# Patient Record
Sex: Male | Born: 2000 | Race: Black or African American | Hispanic: No | Marital: Single | Smoking: Never smoker
Health system: Southern US, Community
[De-identification: ages and names within clinical notes are randomized; demographics above are authoritative.]

## PROBLEM LIST (undated history)

## (undated) HISTORY — PX: OTHER SURGICAL HISTORY: SHX169

---

## 2019-12-25 ENCOUNTER — Encounter (HOSPITAL_COMMUNITY): Payer: Self-pay

## 2019-12-25 ENCOUNTER — Other Ambulatory Visit: Payer: Self-pay

## 2019-12-25 ENCOUNTER — Emergency Department (HOSPITAL_COMMUNITY): Payer: Medicaid Other

## 2019-12-25 ENCOUNTER — Emergency Department (HOSPITAL_COMMUNITY)
Admission: EM | Admit: 2019-12-25 | Discharge: 2019-12-25 | Disposition: A | Discharge status: Home / Self Care | Payer: Medicaid Other | Attending: Emergency Medicine | Admitting: Emergency Medicine

## 2019-12-25 DIAGNOSIS — R079 Chest pain, unspecified: Secondary | ICD-10-CM

## 2019-12-25 LAB — CBC
HCT: 47.4 % (ref 39.0–52.0)
Hemoglobin: 16.1 g/dL (ref 13.0–17.0)
MCH: 29 pg (ref 26.0–34.0)
MCHC: 34 g/dL (ref 30.0–36.0)
MCV: 85.4 fL (ref 80.0–100.0)
Platelets: 276 10*3/uL (ref 150–400)
RBC: 5.55 MIL/uL (ref 4.22–5.81)
RDW: 12.9 % (ref 11.5–15.5)
WBC: 4.6 10*3/uL (ref 4.0–10.5)
nRBC: 0 % (ref 0.0–0.2)

## 2019-12-25 LAB — BASIC METABOLIC PANEL
Anion gap: 9 (ref 5–15)
BUN: 13 mg/dL (ref 6–20)
CO2: 30 mmol/L (ref 22–32)
Calcium: 9.3 mg/dL (ref 8.9–10.3)
Chloride: 102 mmol/L (ref 98–111)
Creatinine, Ser: 1.04 mg/dL (ref 0.61–1.24)
GFR calc Af Amer: >60 mL/min (ref >60)
GFR calc non Af Amer: >60 mL/min (ref >60)
Glucose, Bld: 108 mg/dL — ABNORMAL HIGH (ref 70–99)
Potassium: 4.2 mmol/L (ref 3.5–5.1)
Sodium: 141 mmol/L (ref 135–145)

## 2019-12-25 LAB — TROPONIN I (HIGH SENSITIVITY): Troponin I (High Sensitivity): <2 ng/L (ref <18)

## 2019-12-25 MED ORDER — NAPROXEN 500 MG PO TABS: 500.0000 mg | ORAL_TABLET | Freq: Two times a day (BID) | ORAL | 0 refills | Status: DC | PRN

## 2019-12-25 MED ORDER — NAPROXEN 500 MG PO TABS
500.0000 mg | ORAL_TABLET | Freq: Once | ORAL | Status: AC
  Administered 2019-12-25: 500 mg via ORAL
  Filled 2019-12-25: qty 1

## 2019-12-25 NOTE — Discharge Instructions (Addendum)
You were seen in the emergency department today for chest pain. Your work-up in the emergency department has been overall reassuring. Your labs have been fairly normal and or similar to previous blood work you have had done. Your EKG and the enzyme we use to check your heart did not show an acute heart attack at this time. Your chest x-ray was normal.   We are sending you home with naproxen to help with pain.  - Naproxen is a nonsteroidal anti-inflammatory medication that will help with pain and swelling. Be sure to take this medication as prescribed with food, 1 pill every 12 hours,  It should be taken with food, as it can cause stomach upset, and more seriously, stomach bleeding. Do not take other nonsteroidal anti-inflammatory medications with this such as Advil, Motrin, Aleve, Mobic, Goodie Powder, or Motrin.    You make take Tylenol per over the counter dosing with these medications.   We have prescribed you new medication(s) today. Discuss the medications prescribed today with your pharmacist as they can have adverse effects and interactions with your other medicines including over the counter and prescribed medications. Seek medical evaluation if you start to experience new or abnormal symptoms after taking one of these medicines, seek care immediately if you start to experience difficulty breathing, feeling of your throat closing, facial swelling, or rash as these could be indications of a more serious allergic reaction   We would like you to follow up closely with your primary care provider and/or the cardiologist provided in your discharge instructions within 1-3 days. Return to the ER immediately should you experience any new or worsening symptoms including but not limited to return of pain, worsened pain, vomiting, shortness of breath, dizziness, lightheadedness, passing out, or any other concerns that you may have.

## 2019-12-25 NOTE — ED Triage Notes (Signed)
Pt sts left sided chest tightness for 2 days. Denies pain but describes it as a weird feeling.

## 2019-12-25 NOTE — ED Provider Notes (Signed)
[facility] DEPT Provider Note   CSN: [id number] Arrival date & time: 12/25/19  2055     History Chief Complaint  Patient presents with  . Chest Pain    Daniel Winters is a 19 y.o. male without significant past medical hx who presents to the ED with complaints of chest pain intermittently x 2 days. Patient reports pain is a sharp/tightness to the left chest that lasts a few minutes to a few hours at a time, sometimes with certain breathing, no other triggers or alleviating/aggravating factors, no change with exertion. 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 Last episode of discomfort started about 3-4 hours ago, currently a 3/10 in severity. He denies fever, chills, cough, dyspnea, nausea, vomiting, diaphoresis, dizziness, lightheadedness, numbness, weakness, early family hx of CAD, leg pain/swelling, hemoptysis, recent surgery/trauma, recent long travel, hormone use, personal hx of cancer, or hx of DVT/PE. Denies cocaine/methamphetamine use.  HPI     History reviewed. No pertinent past medical history.  There are no problems to display for this patient.   History reviewed. No pertinent surgical history.     No family history on file.  Social History   Tobacco Use  . Smoking status: Not on file  Substance Use Topics  . Alcohol use: Not on file  . Drug use: Not on file    Home Medications Prior to Admission medications   Not on File    Allergies    Patient has no known allergies.  Review of Systems   Review of Systems  Constitutional: Negative for chills, diaphoresis and fever.  Respiratory: Negative for cough and shortness of breath.   Cardiovascular: Positive for chest pain. Negative for palpitations and leg swelling.  Gastrointestinal: Negative for abdominal pain, diarrhea, nausea and vomiting.  Neurological: Negative for dizziness, syncope, weakness, light-headedness and  numbness.  All other systems reviewed and are negative.   Physical Exam Updated Vital Signs BP (!) 164/100   Pulse 90   Temp 98.3 F (36.8 C)   Resp 16   Ht 6\' 3"  (1.905 m)   Wt 90.7 kg   SpO2 100%   BMI 25.00 kg/m   Physical Exam Vitals and nursing note reviewed.  Constitutional:      General: He is not in acute distress.    Appearance: He is well-developed. He is not toxic-appearing.  HENT:     Head: Normocephalic and atraumatic.  Eyes:     General:        Right eye: No discharge.        Left eye: No discharge.     Conjunctiva/sclera: Conjunctivae normal.  Cardiovascular:     Rate and Rhythm: Normal rate and regular rhythm.     Pulses:          Radial pulses are 2+ on the right side and 2+ on the left side.  Pulmonary:     Effort: Pulmonary effort is normal. No respiratory distress.     Breath sounds: Normal breath sounds. No wheezing, rhonchi or rales.  Chest:     Chest wall: No mass, deformity, tenderness, crepitus or edema.  Abdominal:     General: There is no distension.     Palpations: Abdomen is soft.     Tenderness: There is no abdominal tenderness. There is no guarding or rebound.  Musculoskeletal:     Cervical back: Neck supple.     Right lower leg:  No tenderness. No edema.     Left lower leg: No tenderness. No edema.  Skin:    General: Skin is warm and dry.     Findings: No rash.  Neurological:     Mental Status: He is alert.     Comments: Clear speech.   Psychiatric:        Behavior: Behavior normal.     ED Results / Procedures / Treatments   Labs (all labs ordered are listed, but only abnormal results are displayed) Labs Reviewed  BASIC METABOLIC PANEL - Abnormal; Notable for the following components:      Result Value   Glucose, Bld 108 (*)    All other components within normal limits  CBC  TROPONIN I (HIGH SENSITIVITY)    EKG EKG Interpretation  Date/Time:  Thursday December 25 2019 21:06:48 EDT Ventricular Rate:  86 PR Interval:     QRS Duration: 91 QT Interval:  344 QTC Calculation: 412 R Axis:   88 Text Interpretation: Sinus rhythm Atrial premature complex 12 Lead; [place] No previous ECGs available Confirmed by Richardean Canal [phone]) on 12/25/2019 11:29:22 PM   Radiology DG Chest 2 View  Result Date: 12/25/2019 CLINICAL DATA:  Left-sided chest pain. EXAM: CHEST - 2 VIEW COMPARISON:  None. FINDINGS: The cardiomediastinal contours are normal. The lungs are clear. Pulmonary vasculature is normal. No consolidation, pleural effusion, or pneumothorax. No acute osseous abnormalities are seen. IMPRESSION: Negative radiographs of the chest. Electronically Signed   By: Narda Rutherford M.D.   On: 12/25/2019 21:27    Procedures Procedures (including critical care time)  Medications Ordered in ED Medications  naproxen (NAPROSYN) tablet 500 mg (500 mg Oral Given 12/25/19 2238)    ED Course  I have reviewed the triage vital signs and the nursing notes.  Pertinent labs & imaging results that were available during my care of the patient were reviewed by me and considered in my medical decision making (see chart for details).    MDM Rules/Calculators/A&P                         Patient presents to the ED with complaints of chest pain. Nontoxic, vitals WNL with the exception of elevated BP- normalized by my assessment- low suspicion for HTN emergency.   DDX: ACS, pulmonary embolism, dissection, pneumothorax, pneumonia, arrhythmia, severe anemia, MSK, GERD, anxiety, pleurisy, pericarditis.   Additional history obtained:  Additional history obtained from review of nursing notes.  EKG: No STEMI.  Lab Tests:  I reviewed and interpreted labs, which included:  CBC: No anemia or leukocytosis.  BMP: No significant electrolyte derangement.  Troponin: < 2   Imaging Studies ordered:  Chest x-ray obtained per triage protocol, I independently visualized and interpreted imaging which showed no acute process  ED Course:    Reassuring labs & CXR. HEAR score indicates low risk- EKG without obvious acute ischemia, troponin < 2 after 3 hours of pain, doubt ACS. Patient is low risk wells, PERC negative, doubt pulmonary embolism. Pain is not a tearing sensation, symmetric pulses, no widening of mediastinum on CXR, doubt dissection. Cardiac monitor reviewed, no notable arrhythmias. No ST changes to raise concern for pericarditis. Given dose of naproxen with improvement on re-evaluation at 23:12. Unclear definitive etiology, considering pleurisy vs. MSK- will treat with NSAIDs. Patient has appeared hemodynamically stable throughout ER visit and appears safe for discharge with close PCP follow up. I discussed results, treatment plan, need for PCP follow-up, and  return precautions with the patient. Provided opportunity for questions, patient confirmed understanding and is in agreement with plan.   Blood pressure 132/80, pulse 69, temperature 98.3 F (36.8 C), resp. rate 14, height 6\' 3"  (1.905 m), weight 90.7 kg, SpO2 98 %.  Portions of this note were generated with . Dictation errors may occur despite best attempts at proofreading.  Final Clinical Impression(s) / ED Diagnoses Final diagnoses:  Chest pain, unspecified type    Rx / DC Orders ED Discharge Orders         Ordered    naproxen (NAPROSYN) 500 MG tablet  2 times daily PRN     Discontinue  Reprint     12/25/19 2317           Roselyne Stalnaker, 12/27/19, PA-C 12/25/19 2333    2334, MD 12/29/19 1459

## 2021-10-01 IMAGING — CR DG CHEST 2V
2 series · 2 of 2 positions shown · non-contrast
Comparison: None.

CLINICAL DATA: Left-sided chest pain.

EXAM:
CHEST - 2 VIEW

[w chest pa]
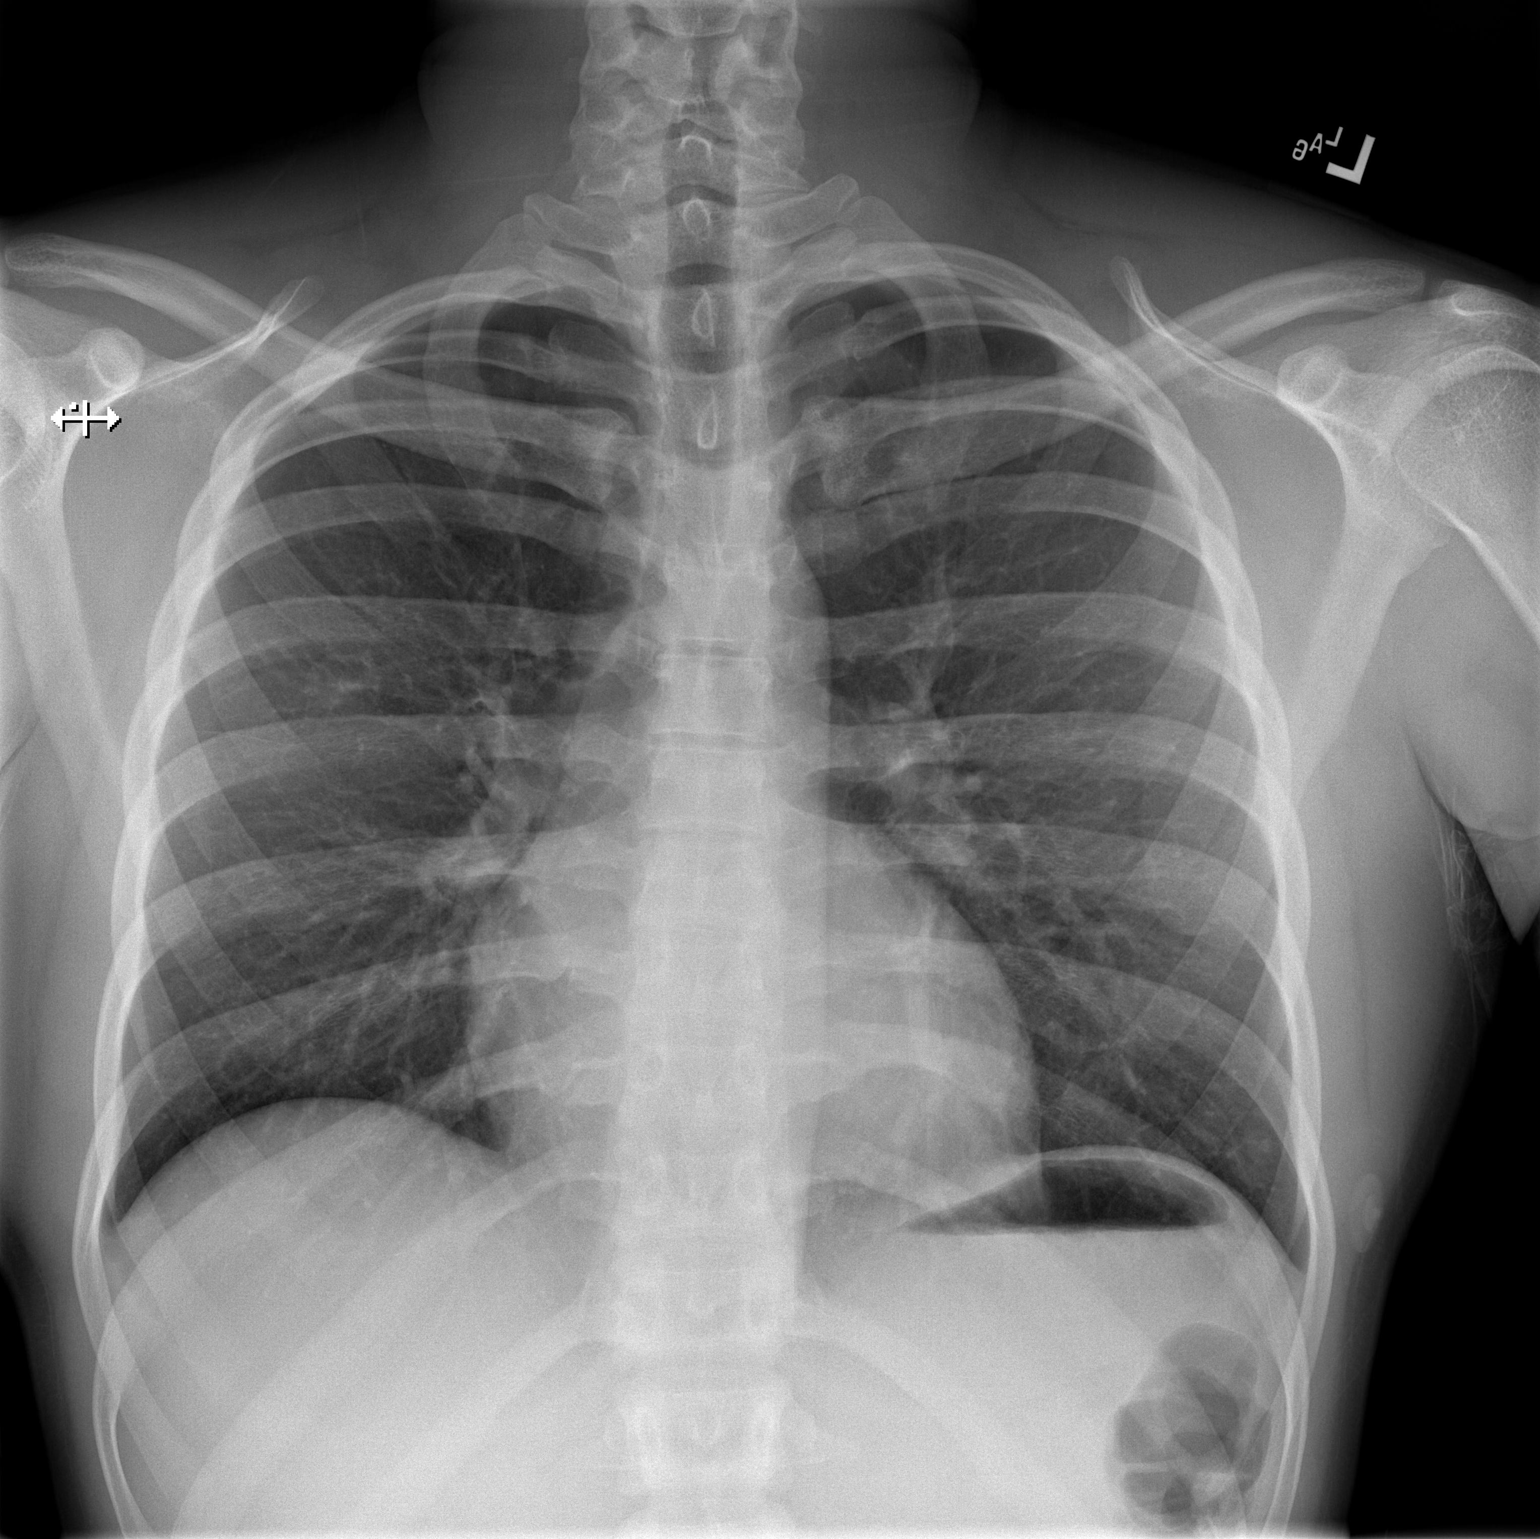

[w chest lat]
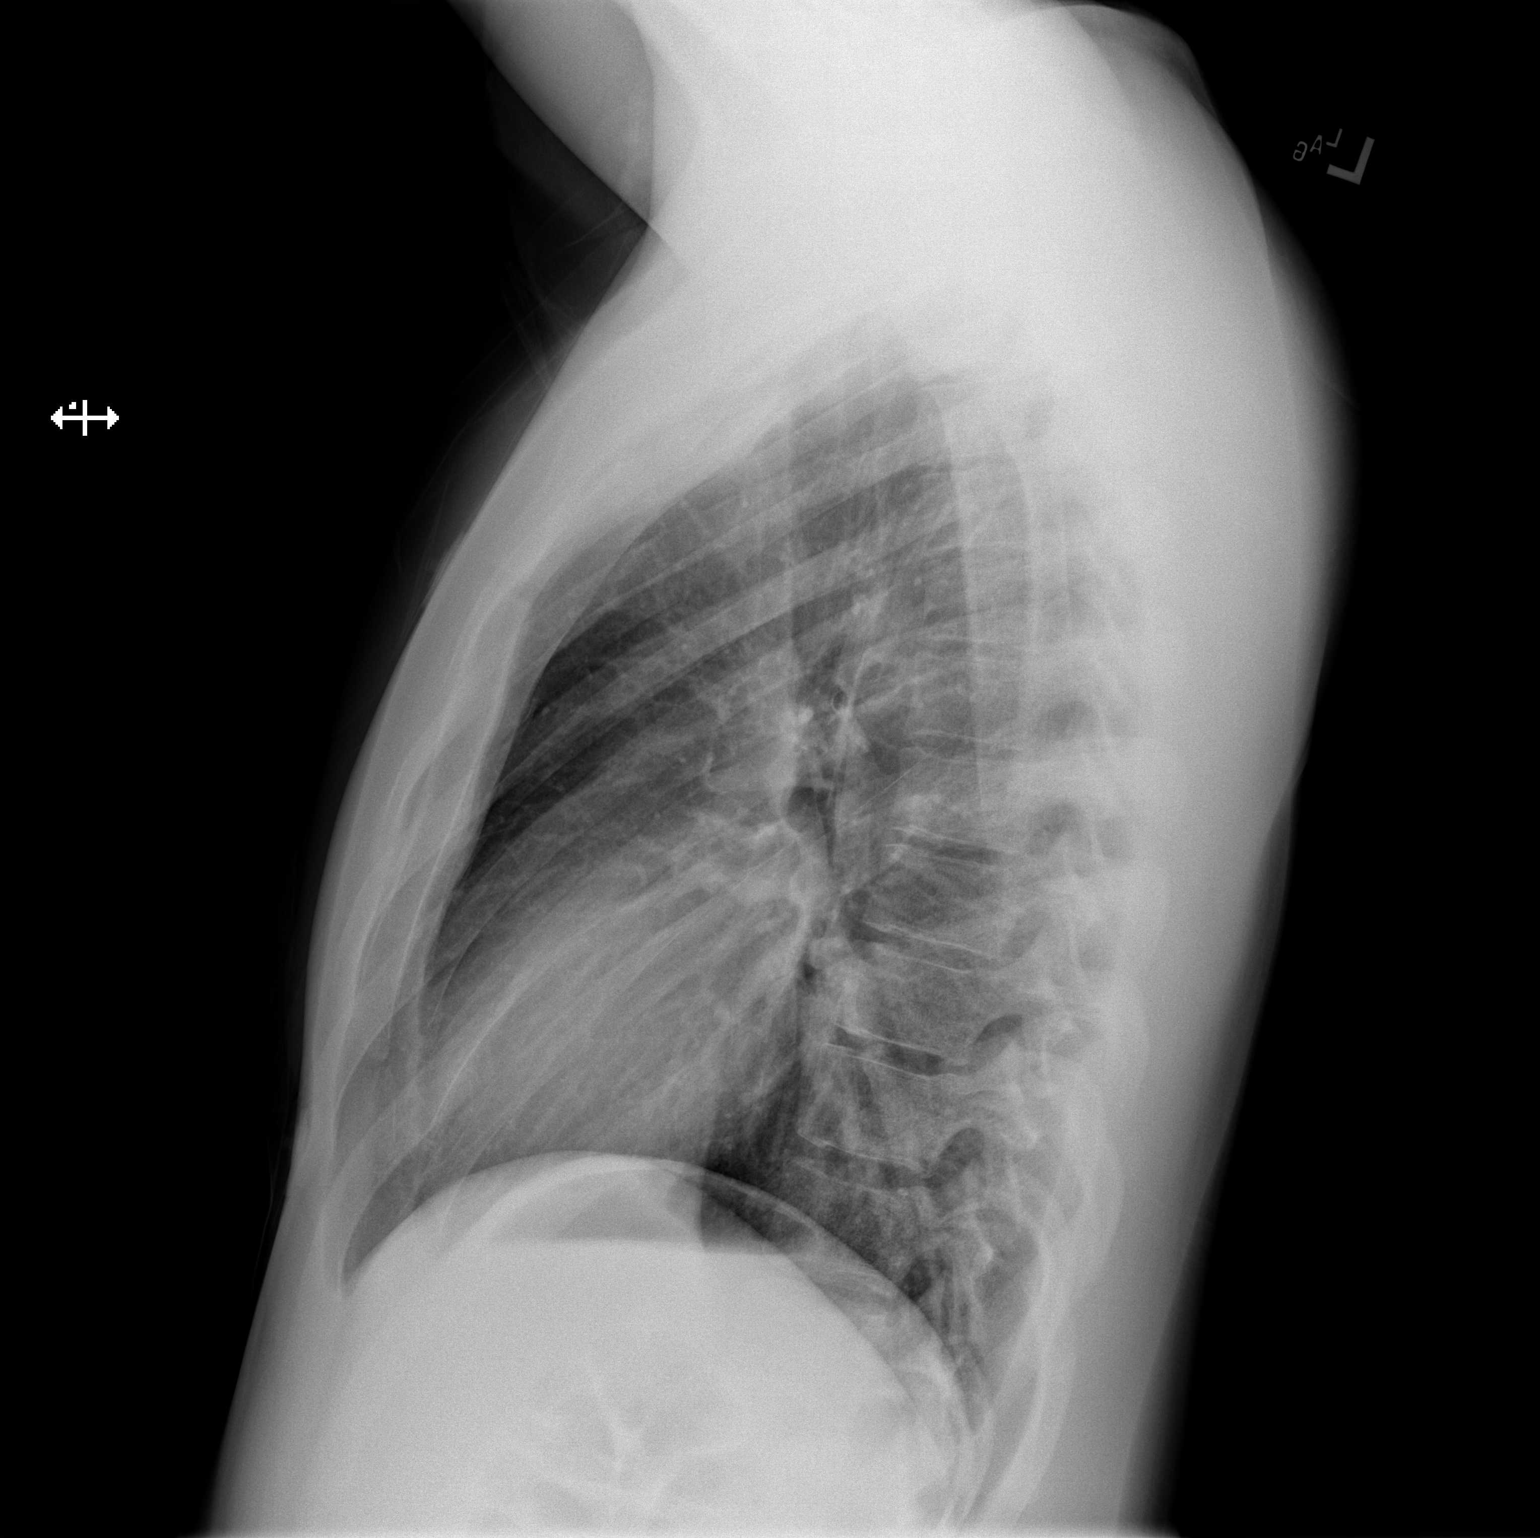

[2 of 2 positions shown; findings below may reference images not displayed]

FINDINGS: The cardiomediastinal contours are normal. The lungs are clear.
Pulmonary vasculature is normal. No consolidation, pleural effusion,
or pneumothorax. No acute osseous abnormalities are seen.
IMPRESSION: Negative radiographs of the chest.

## 2022-03-22 ENCOUNTER — Other Ambulatory Visit (HOSPITAL_BASED_OUTPATIENT_CLINIC_OR_DEPARTMENT_OTHER): Payer: Self-pay | Admitting: Obstetrics & Gynecology

## 2022-03-22 DIAGNOSIS — Z1379 Encounter for other screening for genetic and chromosomal anomalies: Secondary | ICD-10-CM

## 2022-03-23 NOTE — Addendum Note (Signed)
Addended by: Blenda Nicely on: 03/23/2022 12:56 PM   Modules accepted: Orders

## 2022-04-06 LAB — HORIZON CUSTOM: REPORT SUMMARY: NEGATIVE

## 2023-08-24 ENCOUNTER — Emergency Department (HOSPITAL_BASED_OUTPATIENT_CLINIC_OR_DEPARTMENT_OTHER)
Admission: EM | Admit: 2023-08-24 | Discharge: 2023-08-24 | Disposition: A | Discharge status: Home / Self Care | Payer: 59 | Attending: Emergency Medicine | Admitting: Emergency Medicine

## 2023-08-24 ENCOUNTER — Encounter (HOSPITAL_BASED_OUTPATIENT_CLINIC_OR_DEPARTMENT_OTHER): Payer: Self-pay

## 2023-08-24 ENCOUNTER — Other Ambulatory Visit: Payer: Self-pay

## 2023-08-24 DIAGNOSIS — R197 Diarrhea, unspecified: Secondary | ICD-10-CM | POA: Diagnosis present

## 2023-08-24 LAB — CBC
HCT: 43.9 % (ref 39.0–52.0)
Hemoglobin: 15.1 g/dL (ref 13.0–17.0)
MCH: 28.5 pg (ref 26.0–34.0)
MCHC: 34.4 g/dL (ref 30.0–36.0)
MCV: 83 fL (ref 80.0–100.0)
Platelets: 258 10*3/uL (ref 150–400)
RBC: 5.29 MIL/uL (ref 4.22–5.81)
RDW: 12.9 % (ref 11.5–15.5)
WBC: 5.1 10*3/uL (ref 4.0–10.5)
nRBC: 0 % (ref 0.0–0.2)

## 2023-08-24 LAB — COMPREHENSIVE METABOLIC PANEL
ALT: 25 U/L (ref 0–44)
AST: 17 U/L (ref 15–41)
Albumin: 4.6 g/dL (ref 3.5–5.0)
Alkaline Phosphatase: 60 U/L (ref 38–126)
Anion gap: 9 (ref 5–15)
BUN: 11 mg/dL (ref 6–20)
CO2: 24 mmol/L (ref 22–32)
Calcium: 9.1 mg/dL (ref 8.9–10.3)
Chloride: 102 mmol/L (ref 98–111)
Creatinine, Ser: 0.74 mg/dL (ref 0.61–1.24)
GFR, Estimated: >60 mL/min (ref >60)
Glucose, Bld: 109 mg/dL — ABNORMAL HIGH (ref 70–99)
Potassium: 3.7 mmol/L (ref 3.5–5.1)
Sodium: 135 mmol/L (ref 135–145)
Total Bilirubin: 1 mg/dL (ref 0.0–1.2)
Total Protein: 7.3 g/dL (ref 6.5–8.1)

## 2023-08-24 LAB — URINALYSIS, ROUTINE W REFLEX MICROSCOPIC
Bilirubin Urine: NEGATIVE
Glucose, UA: NEGATIVE mg/dL
Hgb urine dipstick: NEGATIVE
Ketones, ur: NEGATIVE mg/dL
Leukocytes,Ua: NEGATIVE
Nitrite: NEGATIVE
Protein, ur: NEGATIVE mg/dL
Specific Gravity, Urine: >=1.03 (ref 1.005–1.030)
pH: 6 (ref 5.0–8.0)

## 2023-08-24 LAB — RESP PANEL BY RT-PCR (RSV, FLU A&B, COVID)  RVPGX2
Influenza A by PCR: NEGATIVE
Influenza B by PCR: NEGATIVE
Resp Syncytial Virus by PCR: NEGATIVE
SARS Coronavirus 2 by RT PCR: NEGATIVE

## 2023-08-24 LAB — LIPASE, BLOOD: Lipase: 28 U/L (ref 11–51)

## 2023-08-24 MED ORDER — ONDANSETRON 4 MG PO TBDP: 4.0000 mg | ORAL_TABLET | Freq: Three times a day (TID) | ORAL | 0 refills | Status: DC | PRN

## 2023-08-24 MED ORDER — LOPERAMIDE HCL 2 MG PO CAPS: 2.0000 mg | ORAL_CAPSULE | Freq: Four times a day (QID) | ORAL | 0 refills | Status: DC | PRN

## 2023-08-24 NOTE — ED Provider Notes (Signed)
 [facility] Provider Note   CSN: [id number] Arrival date & time: 08/24/23  1802    History  Chief Complaint  Patient presents with   Diarrhea   Tingling    Daniel Winters is a 23 y.o. male here for evaluation of diarrhea.  Started today.  Has had some fatigue over the last few days.  Yesterday ate at [employer].  Today has had 6 episodes of loose stool.  No blood in stool.  No abdominal pain.  No recent antibiotics or travel.  No fever, nausea, vomiting, cough, congestion and rhinorrhea.  Has had an overall decreased appetite.  Has been able to drink water however has not any solid food today due to decreased appetite.  Has noted a vibrating sensation to finger tips BIL.  Intermittent in nature.  Not worse with position changes.  No headache, slurred speech, numbness or weakness.  No back pain.  No sick contacts, no one else is sick with had similar food.  That he is aware of    HPI     Home Medications Prior to Admission medications   Medication Sig Start Date End Date Taking? Authorizing Provider  loperamide (IMODIUM) 2 MG capsule Take 1 capsule (2 mg total) by mouth 4 (four) times daily as needed for diarrhea or loose stools. 08/24/23  Yes Alixis Harmon A, PA-C  ondansetron (ZOFRAN-ODT) 4 MG disintegrating tablet Take 1 tablet (4 mg total) by mouth every 8 (eight) hours as needed. 08/24/23  Yes Lovely Kerins A, PA-C  naproxen (NAPROSYN) 500 MG tablet Take 1 tablet (500 mg total) by mouth 2 (two) times daily as needed for moderate pain. 12/25/19   Petrucelli, Pleas Koch, PA-C      Allergies    Patient has no known allergies.    Review of Systems   Review of Systems  Constitutional: Negative.   HENT: Negative.    Respiratory: Negative.    Cardiovascular: Negative.   Gastrointestinal:  Positive for diarrhea. Negative for abdominal distention, abdominal pain, anal bleeding, blood in stool, constipation, nausea, rectal pain and  vomiting.  Genitourinary: Negative.   Musculoskeletal: Negative.   Skin: Negative.   Neurological: Negative.   All other systems reviewed and are negative.   Physical Exam Updated Vital Signs BP 138/77 (BP Location: Left Arm)   Pulse 83   Temp 98.4 F (36.9 C) (Oral)   Resp 18   Ht 6\' 3"  (1.905 m)   Wt 90.7 kg   SpO2 100%   BMI 25.00 kg/m  Physical Exam Vitals and nursing note reviewed.  Constitutional:      General: He is not in acute distress.    Appearance: He is well-developed. He is not ill-appearing, toxic-appearing or diaphoretic.  HENT:     Head: Atraumatic.     Nose: Nose normal.     Mouth/Throat:     Mouth: Mucous membranes are moist.  Eyes:     Pupils: Pupils are equal, round, and reactive to light.  Cardiovascular:     Rate and Rhythm: Normal rate and regular rhythm.     Pulses: Normal pulses.     Heart sounds: Normal heart sounds.  Pulmonary:     Effort: Pulmonary effort is normal. No respiratory distress.     Breath sounds: Normal breath sounds.  Abdominal:     General: Bowel sounds are normal. There is no distension.     Palpations: Abdomen is soft.     Tenderness: There is  no abdominal tenderness. There is no right CVA tenderness, left CVA tenderness, guarding or rebound.  Musculoskeletal:        General: No swelling, tenderness, deformity or signs of injury. Normal range of motion.     Cervical back: Normal range of motion and neck supple.     Right lower leg: No edema.     Left lower leg: No edema.  Skin:    General: Skin is warm and dry.     Capillary Refill: Capillary refill takes less than 2 seconds.  Neurological:     General: No focal deficit present.     Mental Status: He is alert and oriented to person, place, and time.     Cranial Nerves: No cranial nerve deficit.     Sensory: No sensory deficit.     Motor: No weakness.     Gait: Gait normal.     ED Results / Procedures / Treatments   Labs (all labs ordered are listed, but only  abnormal results are displayed) Labs Reviewed  COMPREHENSIVE METABOLIC PANEL - Abnormal; Notable for the following components:      Result Value   Glucose, Bld 109 (*)    All other components within normal limits  RESP PANEL BY RT-PCR (RSV, FLU A&B, COVID)  RVPGX2  LIPASE, BLOOD  CBC  URINALYSIS, ROUTINE W REFLEX MICROSCOPIC    EKG None  Radiology No results found.  Procedures Procedures    Medications Ordered in ED Medications - No data to display  ED Course/ Medical Decision Making/ A&P   23 year old here for evaluation of diarrhea.  Started today. 5-6 episodes of nonbloody stool.  No abdominal pain.  Did eat [employer] yesterday.  No known sick contacts.  Has had some decreased appetite.  Earlier had some vibration sense to his distal fingers.  Subsequently resolved.  He has a nonfocal neuroexam without deficits.  No headache.  Will plan on labs and reassess  Labs personally viewed and interpreted:  Viral panel negative CBC without leukocytosis Metabolic panel without significant abnormality Lipase 28 UA negative for infection  Patient reassessed.  We discussed his labs.  I suspect he likely has gastroenteritis.  No recent antibiotics or travel to suggest bacterial source of diarrhea.  His abdomen is soft, nontender.  Discussed symptomatic management help him follow-up outpatient, return for worsening symptoms  Patient is nontoxic, nonseptic appearing, in no apparent distress.  Patient's pain and other symptoms adequately managed in emergency department.  Fluid bolus given.  Labs, imaging and vitals reviewed.  Patient does not meet the SIRS or Sepsis criteria.  On repeat exam patient does not have a surgical abdomin and there are no peritoneal signs.  No indication of appendicitis, bowel obstruction, bowel perforation, cholecystitis, diverticulitis.  Patient discharged home with symptomatic treatment and given strict instructions for follow-up with their primary care  physician.  I have also discussed reasons to return immediately to the ER.  Patient expresses understanding and agrees with plan.                                 Medical Decision Making Amount and/or Complexity of Data Reviewed Independent Historian: friend External Data Reviewed: labs, radiology and notes. Labs: ordered. Decision-making details documented in ED Course.  Risk OTC drugs. Prescription drug management. Decision regarding hospitalization. Diagnosis or treatment significantly limited by social determinants of health.          Final  Clinical Impression(s) / ED Diagnoses Final diagnoses:  Diarrhea, unspecified type    Rx / DC Orders ED Discharge Orders          Ordered    ondansetron (ZOFRAN-ODT) 4 MG disintegrating tablet  Every 8 hours PRN        08/24/23 1918    loperamide (IMODIUM) 2 MG capsule  4 times daily PRN        08/24/23 1918              Veroncia Jezek A, PA-C 08/24/23 2331    Pricilla Loveless, MD 08/29/23 1056

## 2023-08-24 NOTE — Discharge Instructions (Signed)
It was a pleasure taking care of you today.  Your work up was reassuring you do have a viral infection that is causing your loose stool or food poisoning  We have written for a few medications to help  Make sure to hydrate with Gatorade, Pedialyte at home to make sure you are staying hydrated  Follow-up outpatient, return for new or worsening symptoms such as high fever, blood in stool, severe abdominal pain

## 2023-08-24 NOTE — ED Triage Notes (Signed)
Patient arrives POV with complaints of ongoing diarrhea that started today (6-7 occurrences of diarrhea) and some tingling in his hands. Reports no pain.

## 2023-09-24 ENCOUNTER — Ambulatory Visit (HOSPITAL_BASED_OUTPATIENT_CLINIC_OR_DEPARTMENT_OTHER)
Admission: RE | Admit: 2023-09-24 | Discharge: 2023-09-24 | Disposition: A | Discharge status: Home / Self Care | Source: Ambulatory Visit | Attending: Urgent Care | Admitting: Urgent Care

## 2023-09-24 ENCOUNTER — Ambulatory Visit
Admission: RE | Admit: 2023-09-24 | Discharge: 2023-09-24 | Disposition: A | Discharge status: Home / Self Care | Source: Ambulatory Visit | Attending: Family Medicine | Admitting: Family Medicine

## 2023-09-24 VITALS — BP 119/80 | HR 81 | Temp 98.0°F | Resp 16

## 2023-09-24 DIAGNOSIS — R0789 Other chest pain: Secondary | ICD-10-CM | POA: Insufficient documentation

## 2023-09-24 DIAGNOSIS — R5383 Other fatigue: Secondary | ICD-10-CM

## 2023-09-24 MED ORDER — NAPROXEN 500 MG PO TABS: 500.0000 mg | ORAL_TABLET | Freq: Two times a day (BID) | ORAL | 0 refills | Status: AC | PRN

## 2023-09-24 NOTE — ED Provider Notes (Signed)
 [facility]  Note:  This document was prepared using Conservation officer, historic buildings and may include unintentional dictation errors.  MRN: [id number] DOB: 2000/11/27  Subjective:   Daniel Winters is a 23 y.o. male presenting for 1 month history of persistent intermittent dizziness, left-sided chest pain and chest tightness, brain fog, fatigue, left arm numbness and tingling including the hand.  Was initially seen a month ago through the emergency room.  Lab test were negative.  No chest x-ray or EKG was done.  Patient cannot recall if he had chest pain at the time.  He does report a similar episode that happened a few years ago and resolved with the use of naproxen.  He did go see his PCP but did not complete workup with them.    No confusion, weakness, vision changes, speech changes, shortness of breath, wheezing, nausea, vomiting, abdominal pain, rashes, joint pains, bruising, weight loss.  No drug use.  No alcohol use.  No smoking of any kind.  No current facility-administered medications for this encounter.  Current Outpatient Medications:    loperamide (IMODIUM) 2 MG capsule, Take 1 capsule (2 mg total) by mouth 4 (four) times daily as needed for diarrhea or loose stools., Disp: 12 capsule, Rfl: 0   naproxen (NAPROSYN) 500 MG tablet, Take 1 tablet (500 mg total) by mouth 2 (two) times daily as needed for moderate pain (pain score 4-6)., Disp: 15 tablet, Rfl: 0   ondansetron (ZOFRAN-ODT) 4 MG disintegrating tablet, Take 1 tablet (4 mg total) by mouth every 8 (eight) hours as needed., Disp: 20 tablet, Rfl: 0   No Known Allergies  History reviewed. No pertinent past medical history.   History reviewed. No pertinent surgical history.  No family history on file.  Social History   Tobacco Use   Smoking status: Never   Smokeless tobacco: Never  Vaping Use   Vaping status: Never Used  Substance Use Topics   Alcohol use: Not Currently   Drug use: Not  Currently    ROS   Objective:   Vitals: BP 119/80 (BP Location: Left Arm)   Pulse 81   Temp 98 F (36.7 C) (Oral)   Resp 16   SpO2 98%   Physical Exam Constitutional:      General: He is not in acute distress.    Appearance: Normal appearance. He is well-developed and normal weight. He is not ill-appearing, toxic-appearing or diaphoretic.  HENT:     Head: Normocephalic and atraumatic.     Right Ear: External ear normal.     Left Ear: External ear normal.     Nose: Nose normal.     Mouth/Throat:     Mouth: Mucous membranes are moist.     Pharynx: No pharyngeal swelling, oropharyngeal exudate, posterior oropharyngeal erythema or uvula swelling.     Tonsils: No tonsillar exudate or tonsillar abscesses. 0 on the right. 0 on the left.  Eyes:     General: No scleral icterus.       Right eye: No discharge.        Left eye: No discharge.     Extraocular Movements: Extraocular movements intact.  Cardiovascular:     Rate and Rhythm: Normal rate and regular rhythm.     Heart sounds: Normal heart sounds. No murmur heard.    No friction rub. No gallop.  Pulmonary:     Effort: Pulmonary effort is normal. No respiratory distress.     Breath sounds: Normal breath sounds.  No stridor. No wheezing, rhonchi or rales.  Musculoskeletal:     Cervical back: Normal range of motion.  Neurological:     Mental Status: He is alert and oriented to person, place, and time.     Cranial Nerves: No cranial nerve deficit.     Motor: No weakness.     Coordination: Coordination normal.     Gait: Gait normal.  Psychiatric:        Mood and Affect: Mood normal.        Behavior: Behavior normal.        Thought Content: Thought content normal.        Judgment: Judgment normal.    ED ECG REPORT   Date: 09/24/2023  EKG Time: 12:56 PM  Rate: 69 bpm  Rhythm: normal sinus rhythm  Axis: Normal  Intervals: Incomplete right bundle branch block  ST&T Change: None  Narrative Interpretation: Sinus rhythm  at 69 bpm with an incomplete right bundle branch block.  The latter is new compared to his EKG from 2021.  Recent Results (from the past 2160 hours)  Lipase, blood     Status: None   Collection Time: 08/24/23  6:09 PM  Result Value Ref Range   Lipase 28 11 - 51 U/L    Comment: Performed at [facility], [address] [place], [place] [address]  Comprehensive metabolic panel     Status: Abnormal   Collection Time: 08/24/23  6:09 PM  Result Value Ref Range   Sodium 135 135 - 145 mmol/L   Potassium 3.7 3.5 - 5.1 mmol/L   Chloride 102 98 - 111 mmol/L   CO2 24 22 - 32 mmol/L   Glucose, Bld 109 (H) 70 - 99 mg/dL    Comment: Glucose reference range applies only to samples taken after fasting for at least 8 hours.   BUN 11 6 - 20 mg/dL   Creatinine, Ser 6.04 0.61 - 1.24 mg/dL   Calcium 9.1 8.9 - 54.0 mg/dL   Total Protein 7.3 6.5 - 8.1 g/dL   Albumin 4.6 3.5 - 5.0 g/dL   AST 17 15 - 41 U/L   ALT 25 0 - 44 U/L   Alkaline Phosphatase 60 38 - 126 U/L   Total Bilirubin 1.0 0.0 - 1.2 mg/dL   GFR, Estimated >98 >11 mL/min    Comment: (NOTE) Calculated using the CKD-EPI Creatinine Equation (2021)    Anion gap 9 5 - 15    Comment: Performed at [facility], [address] [place], [place] [address]  CBC     Status: None   Collection Time: 08/24/23  6:09 PM  Result Value Ref Range   WBC 5.1 4.0 - 10.5 K/uL   RBC 5.29 4.22 - 5.81 MIL/uL   Hemoglobin 15.1 13.0 - 17.0 g/dL   HCT 29.5 62.1 - 30.8 %   MCV 83.0 80.0 - 100.0 fL   MCH 28.5 26.0 - 34.0 pg   MCHC 34.4 30.0 - 36.0 g/dL   RDW 65.7 84.6 - 96.2 %   Platelets 258 150 - 400 K/uL   nRBC 0.0 0.0 - 0.2 %    Comment: Performed at [facility], [address] [place], [place] [address]  Urinalysis, Routine w reflex microscopic -Urine, Clean Catch     Status: None   Collection Time: 08/24/23  6:09 PM  Result Value Ref Range   Color, Urine YELLOW YELLOW   APPearance CLEAR CLEAR  Specific Gravity,  Urine >=1.030 1.005 - 1.030   pH 6.0 5.0 - 8.0   Glucose, UA NEGATIVE NEGATIVE mg/dL   Hgb urine dipstick NEGATIVE NEGATIVE   Bilirubin Urine NEGATIVE NEGATIVE   Ketones, ur NEGATIVE NEGATIVE mg/dL   Protein, ur NEGATIVE NEGATIVE mg/dL   Nitrite NEGATIVE NEGATIVE   Leukocytes,Ua NEGATIVE NEGATIVE    Comment: Microscopic not done on urines with negative protein, blood, leukocytes, nitrite, or glucose < 500 mg/dL. Performed at [facility], [address] [place], [place] [address]   Resp panel by RT-PCR (RSV, Flu A&B, Covid) Anterior Nasal Swab     Status: None   Collection Time: 08/24/23  7:20 PM   Specimen: Anterior Nasal Swab  Result Value Ref Range   SARS Coronavirus 2 by RT PCR NEGATIVE NEGATIVE    Comment: (NOTE) SARS-CoV-2 target nucleic acids are NOT DETECTED.  The SARS-CoV-2 RNA is generally detectable in upper respiratory specimens during the acute phase of infection. The lowest concentration of SARS-CoV-2 viral copies this assay can detect is 138 copies/mL. A negative result does not preclude SARS-Cov-2 infection and should not be used as the sole basis for treatment or other patient management decisions. A negative result may occur with  improper specimen collection/handling, submission of specimen other than nasopharyngeal swab, presence of viral mutation(s) within the areas targeted by this assay, and inadequate number of viral copies(<138 copies/mL). A negative result must be combined with clinical observations, patient history, and epidemiological information. The expected result is Negative.  Fact Sheet for Patients:  BloggerCourse.com  Fact Sheet for Healthcare Providers:  SeriousBroker.it  This test is no t yet approved or cleared by the [place] FDA and  has been authorized for detection and/or diagnosis of SARS-CoV-2 by FDA under an Emergency Use Authorization (EUA). This EUA will  remain  in effect (meaning this test can be used) for the duration of the COVID-19 declaration under Section 564(b)(1) of the Act, 21 U.S.C.section 360bbb-3(b)(1), unless the authorization is terminated  or revoked sooner.       Influenza A by PCR NEGATIVE NEGATIVE   Influenza B by PCR NEGATIVE NEGATIVE    Comment: (NOTE) The Xpert Xpress SARS-CoV-2/FLU/RSV plus assay is intended as an aid in the diagnosis of influenza from Nasopharyngeal swab specimens and should not be used as a sole basis for treatment. Nasal washings and aspirates are unacceptable for Xpert Xpress SARS-CoV-2/FLU/RSV testing.  Fact Sheet for Patients: BloggerCourse.com  Fact Sheet for Healthcare Providers: SeriousBroker.it  This test is not yet approved or cleared by the [place] FDA and has been authorized for detection and/or diagnosis of SARS-CoV-2 by FDA under an Emergency Use Authorization (EUA). This EUA will remain in effect (meaning this test can be used) for the duration of the COVID-19 declaration under Section 564(b)(1) of the Act, 21 U.S.C. section 360bbb-3(b)(1), unless the authorization is terminated or revoked.     Resp Syncytial Virus by PCR NEGATIVE NEGATIVE    Comment: (NOTE) Fact Sheet for Patients: BloggerCourse.com  Fact Sheet for Healthcare Providers: SeriousBroker.it  This test is not yet approved or cleared by the [place] FDA and has been authorized for detection and/or diagnosis of SARS-CoV-2 by FDA under an Emergency Use Authorization (EUA). This EUA will remain in effect (meaning this test can be used) for the duration of the COVID-19 declaration under Section 564(b)(1) of the Act, 21 U.S.C. section 360bbb-3(b)(1), unless the authorization is terminated or revoked.  Performed at [facility], [address] [place], [place] [address]     Assessment  and Plan :   PDMP not reviewed this encounter.  1. Atypical chest pain   2. Other fatigue    Chart review from his ER visit.  Will defer repeat testing.  TSH pending.  Recommend referral to cardiology given his cardiopulmonary symptoms, abnormal EKG.  Offered naproxen as this has helped patient in the past with chest wall pain.  None appreciated in clinic today.  Recommend follow-up with his PCP for consideration of repeat testing and a recheck in general.  Consider anxiety as a possible source of his multisystem symptoms.  Counseled patient on potential for adverse effects with medications prescribed/recommended today, ER and return-to-clinic precautions discussed, patient verbalized understanding.    Wallis Bamberg, PA-C 09/24/23 1259

## 2023-09-24 NOTE — ED Triage Notes (Signed)
 Pt c/o dizziness, tingling in LUE, "brain fog", HA, fatigue sx started 2/21-c/o CP started last week-pt was seen for the multiple c/o 3/13 at [facility] PCP-did not wear heart monitor as directed and did not get CXR as ordered by PCP-pt NAD-steady gait

## 2023-09-24 NOTE — Discharge Instructions (Addendum)
 I have placed orders to have an x-ray done at the med center in [facility].  Please had there now.  Go through the main hospital and not the emergency room.  Once you are there and let them know that you will came to our clinic and we send she to their facility for an outpatient x-ray.  If no one is at the front desk then they are likely out the rest of the day and at that point you would have to go through the emergency room.  Do not check in as a patient through the emergency room.  Simply let them know that you are there for an outpatient x-ray from our clinic.  I will call you with your results and update our treatment plan if necessary after I get the report.    I did place a referral to a cardiology practice for you to have a consultation regarding your persistent chest pain and abnormal heart study.  In the meantime, you can use naproxen for musculoskeletal chest pain.

## 2023-09-25 LAB — TSH: TSH: 1.96 u[IU]/mL (ref 0.450–4.500)

## 2023-10-02 ENCOUNTER — Ambulatory Visit

## 2023-10-02 VITALS — BP 114/72 | HR 81 | Ht 74.0 in | Wt 208.8 lb

## 2023-10-02 DIAGNOSIS — R9431 Abnormal electrocardiogram [ECG] [EKG]: Secondary | ICD-10-CM

## 2023-10-02 HISTORY — DX: Abnormal electrocardiogram (ECG) (EKG): R94.31

## 2023-10-02 NOTE — Assessment & Plan Note (Signed)
 Atypical symptoms of chest discomfort do not appear to be exertional.  However he has cut down exertional activities over the past month but continues to go to his job.  RSR prime pattern with incomplete RBBB noted at recent urgent care visit on EKG is not seen on the EKG today at the office.  Normal variant nature of this finding reviewed with him.  He does not have any other significant concerning findings as reviewed.  Given his job requirements and need for exertional activities, we will proceed with a treadmill EKG stress test for objective assessment of his physical capacity to rule out any significant findings that would limit him.  Further follow-up in the office based on test results.

## 2023-10-02 NOTE — Progress Notes (Signed)
 Cardiology Consultation:    Date:  10/02/2023   ID:  Daniel Winters, DOB 09/04/2000, MRN [id number]  PCP:  Pcp, No  Cardiologist:  Luretha Murphy, MD   Referring MD: Wallis Bamberg, PA-C   No chief complaint on file.    ASSESSMENT AND PLAN:   Daniel Winters 23 year old very pleasant young man with no major health issues and currently not on any medications at home had atypical symptoms of brain fog over a month ago which lasted for couple weeks the symptoms subsided, reported atypical symptoms of chest discomfort pressure-like occurring intermittently.  Symptoms appear likely musculoskeletal however cannot clearly explain the atypical nature of chest discomfort and exertional symptoms.  He does not have any other concerning findings of fever, weight changes,  Problem List Items Addressed This Visit     Nonspecific abnormal electrocardiogram (ECG) (EKG) - Primary   Atypical symptoms of chest discomfort do not appear to be exertional.  However he has cut down exertional activities over the past month but continues to go to his job.  RSR prime pattern with incomplete RBBB noted at recent urgent care visit on EKG is not seen on the EKG today at the office.  Normal variant nature of this finding reviewed with him.  He does not have any other significant concerning findings as reviewed.  Given his job requirements and need for exertional activities, we will proceed with a treadmill EKG stress test for objective assessment of his physical capacity to rule out any significant findings that would limit him.  Further follow-up in the office based on test results.      Relevant Orders   EKG 12-Lead (Completed)   Exercise Tolerance Test      History of Present Illness:    Daniel Winters is a 23 y.o. male who is being seen today for the evaluation of chest pain and dizziness at the request of Wallis Bamberg, [place].   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  Lives at home with his fiance and 57-year-old child.  Over the past month he has had visits to urgent care facility and PCP for symptoms of brain fog, associated with shoulder discomfort and arm discomfort last week of February.  Those symptoms continued for about couple weeks.  Subsequently had visit to his PCP and discussed atypical symptoms of chest discomfort EKG at the time was reportedly unremarkable.   Was recently at  [facility] at[facility] for symptoms of intermittent dizziness and left-sided chest discomfort describes as pressure-like sensation.  These have been present on and off.  More recently yesterday he had sensation of sharp discomfort on the left side under the breast region which was momentary.  Referred for outpatient follow-up with cardiology.  He had similar episodes back in June 2021 and symptoms improved with naproxen.  He has not used any of the naproxen. Does not have any persistent symptoms.  Denies any fever or chills.  Denies being in contact with anyone with respiratory infections. Denies any shortness of breath, cough, weight gain. Denies any palpitations, lightheadedness, syncopal episodes.  Mentions in hindsight he thinks things may have been overwhelming a month ago with work and activities at home.  He feels things are settling down and he reports good quality of sleep.  No significant mood changes.  Does not smoke, drink alcohol, do any substance abuse. Occasional coffee use. As part of his  job he does exercise routinely over the past month this is a decrease but he is still been able to keep up.   No significant family history of heart disease.  EKG in the clinic today shows sinus rhythm heart rate 81/min, PR interval 172 ms, QRS duration 96 ms, nonpathological inferior Q waves.  Normal variant.  In comparison prior EKG was as below. EKG at recent urgent care visit 09-24-2023 sinus  rhythm heart rate 69/min, PR interval normal 188 ms, normal QRS axis, incomplete RBBB morphology suggested by RSR prime pattern in lead V1 with QRS duration 108 ms.  Likely normal variant given young age.  Blood work from 08-24-2023 reviewed CBC, CMP, and 09-24-2023 TSH levels were unremarkable.  History reviewed. No pertinent past medical history.  Past Surgical History:  Procedure Laterality Date   no surgical history      Current Medications: Current Meds  Medication Sig   naproxen (NAPROSYN) 500 MG tablet Take 1 tablet (500 mg total) by mouth 2 (two) times daily as needed for moderate pain (pain score 4-6).     Allergies:   Patient has no known allergies.   Social History   Socioeconomic History   Marital status: Single    Spouse name: Not on file   Number of children: Not on file   Years of education: Not on file   Highest education level: Not on file  Occupational History   Not on file  Tobacco Use   Smoking status: Never   Smokeless tobacco: Never  Vaping Use   Vaping status: Never Used  Substance and Sexual Activity   Alcohol use: Never   Drug use: Never   Sexual activity: Yes    Birth control/protection: None  Other Topics Concern   Not on file  Social History Narrative   Not on file   Social Drivers of Health   Financial Resource Strain: Not on file  Food Insecurity: Not on file  Transportation Needs: Not on file  Physical Activity: Not on file  Stress: Not on file  Social Connections: Not on file     Family History: The patient's family history includes Diabetes in his paternal grandfather. ROS:   Please see the history of present illness.    All 14 point review of systems negative except as described per history of present illness.  EKGs/Labs/Other Studies Reviewed:    The following studies were reviewed today:   EKG:  EKG Interpretation Date/Time:  Tuesday October 02 2023 14:41:46 EDT Ventricular Rate:  81 PR Interval:  172 QRS  Duration:  96 QT Interval:  368 QTC Calculation: 427 R Axis:   68  Text Interpretation: Normal sinus rhythm Possible Inferior infarct , age undetermined When compared with ECG of 24-Sep-2023 12:34, No significant change was found Confirmed by Huntley Dec reddy [phone]) on 10/02/2023 2:46:48 PM    Recent Labs: 08/24/2023: ALT 25; BUN 11; Creatinine, Ser 0.74; Hemoglobin 15.1; Platelets 258; Potassium 3.7; Sodium 135 09/24/2023: TSH 1.960  Recent Lipid Panel No results found for: "CHOL", "TRIG", "HDL", "CHOLHDL", "VLDL", "LDLCALC", "LDLDIRECT"  Physical Exam:    VS:  BP 114/72   Pulse 81   Ht 6\' 2"  (1.88 m)   Wt 208 lb 12.8 oz (94.7 kg)   SpO2 98%   BMI 26.81 kg/m     Wt Readings from Last 3 Encounters:  10/02/23 208 lb 12.8 oz (94.7 kg)  08/24/23 200 lb (90.7 kg)  12/25/19 200 lb (90.7 kg)  GENERAL:  Well nourished, well developed in no acute distress NECK: No JVD; No carotid bruits CARDIAC: RRR, S1 and S2 present, no murmurs, no rubs, no gallops CHEST:  Clear to auscultation without rales, wheezing or rhonchi  Extremities: No pitting pedal edema. Pulses bilaterally symmetric with radial 2+ and dorsalis pedis 2+ NEUROLOGIC:  Alert and oriented x 3  Medication Adjustments/Labs and Tests Ordered: Current medicines are reviewed at length with the patient today.  Concerns regarding medicines are outlined above.  Orders Placed This Encounter  Procedures   Exercise Tolerance Test   EKG 12-Lead   No orders of the defined types were placed in this encounter.   Signed, Cecille Amsterdam, MD, MPH, [facility]. 10/02/2023 3:07 PM    [facility]

## 2023-10-02 NOTE — Patient Instructions (Signed)
 Medication Instructions:  Your physician recommends that you continue on your current medications as directed. Please refer to the Current Medication list given to you today.  *If you need a refill on your cardiac medications before your next appointment, please call your pharmacy*   Lab Work: None Ordered If you have labs (blood work) drawn today and your tests are completely normal, you will receive your results only by: MyChart Message (if you have MyChart) OR A paper copy in the mail If you have any lab test that is abnormal or we need to change your treatment, we will call you to review the results.   Testing/Procedures: Treadmill Stress Test Instructions:    1. You may take all of your medications   2. No food, drink or tobacco products 2 hours prior to your test.  3. Dress prepared to exercise. Best to wear 2 piece outfit and tennis shoes. Shoes must be closed toe.  4. Please bring all current prescription medications.    Follow-Up: At [facility], you and your health needs are our priority.  As part of our continuing mission to provide you with exceptional heart care, we have created designated Provider Care Teams.  These Care Teams include your primary Cardiologist (physician) and Advanced Practice Providers (APPs -  Physician Assistants and Nurse Practitioners) who all work together to provide you with the care you need, when you need it.  We recommend signing up for the patient portal called "MyChart".  Sign up information is provided on this After Visit Summary.  MyChart is used to connect with patients for Virtual Visits (Telemedicine).  Patients are able to view lab/test results, encounter notes, upcoming appointments, etc.  Non-urgent messages can be sent to your provider as well.   To learn more about what you can do with MyChart, go to [email or web address].    Your next appointment:   Based on test results

## 2023-10-09 ENCOUNTER — Telehealth: Payer: Self-pay

## 2023-10-09 NOTE — Telephone Encounter (Signed)
 Attempted to contact the patient. Answering machine was full. S.Charliene Inoue CCT

## 2023-10-16 ENCOUNTER — Ambulatory Visit

## 2023-10-30 ENCOUNTER — Other Ambulatory Visit: Payer: Self-pay

## 2023-10-30 ENCOUNTER — Encounter: Payer: Self-pay | Admitting: Neurology

## 2023-10-30 DIAGNOSIS — R202 Paresthesia of skin: Secondary | ICD-10-CM

## 2023-11-18 ENCOUNTER — Telehealth: Payer: Self-pay

## 2023-11-18 ENCOUNTER — Ambulatory Visit

## 2023-11-18 ENCOUNTER — Emergency Department (HOSPITAL_BASED_OUTPATIENT_CLINIC_OR_DEPARTMENT_OTHER)

## 2023-11-18 ENCOUNTER — Emergency Department (HOSPITAL_BASED_OUTPATIENT_CLINIC_OR_DEPARTMENT_OTHER)
Admission: EM | Admit: 2023-11-18 | Discharge: 2023-11-18 | Disposition: A | Discharge status: Home / Self Care | Attending: Emergency Medicine | Admitting: Emergency Medicine

## 2023-11-18 ENCOUNTER — Other Ambulatory Visit: Payer: Self-pay

## 2023-11-18 ENCOUNTER — Encounter (HOSPITAL_BASED_OUTPATIENT_CLINIC_OR_DEPARTMENT_OTHER): Payer: Self-pay

## 2023-11-18 DIAGNOSIS — R0789 Other chest pain: Secondary | ICD-10-CM | POA: Diagnosis present

## 2023-11-18 DIAGNOSIS — R519 Headache, unspecified: Secondary | ICD-10-CM | POA: Diagnosis not present

## 2023-11-18 DIAGNOSIS — R4189 Other symptoms and signs involving cognitive functions and awareness: Secondary | ICD-10-CM | POA: Diagnosis not present

## 2023-11-18 LAB — BASIC METABOLIC PANEL WITH GFR
Anion gap: 11 (ref 5–15)
BUN: 11 mg/dL (ref 6–20)
CO2: 26 mmol/L (ref 22–32)
Calcium: 9.6 mg/dL (ref 8.9–10.3)
Chloride: 102 mmol/L (ref 98–111)
Creatinine, Ser: 0.85 mg/dL (ref 0.61–1.24)
GFR, Estimated: >60 mL/min (ref >60)
Glucose, Bld: 103 mg/dL — ABNORMAL HIGH (ref 70–99)
Potassium: 4.3 mmol/L (ref 3.5–5.1)
Sodium: 139 mmol/L (ref 135–145)

## 2023-11-18 LAB — CBC
HCT: 44.5 % (ref 39.0–52.0)
Hemoglobin: 15.3 g/dL (ref 13.0–17.0)
MCH: 28.3 pg (ref 26.0–34.0)
MCHC: 34.4 g/dL (ref 30.0–36.0)
MCV: 82.4 fL (ref 80.0–100.0)
Platelets: 247 10*3/uL (ref 150–400)
RBC: 5.4 MIL/uL (ref 4.22–5.81)
RDW: 13.2 % (ref 11.5–15.5)
WBC: 5 10*3/uL (ref 4.0–10.5)
nRBC: 0 % (ref 0.0–0.2)

## 2023-11-18 LAB — TROPONIN T, HIGH SENSITIVITY: Troponin T High Sensitivity: <15 ng/L (ref <19)

## 2023-11-18 NOTE — Discharge Instructions (Signed)
 Your workup today is reassuring. It is very unlikely that your pain in your chest is due to an issue in your heart.  Your cardiac enzyme (troponin) was normal today. Your EKG which is a measure of the heart's electrical activity and rhythm is normal today.   Your chest x-ray is normal today.  Your blood counts, electrolytes, and kidney function were normal today.  Your head CT was normal and did not show any abnormalities that would explain your brain fog or headache.  You may take up to 1000mg  of tylenol every 6 hours as needed for pain.  Do not take more then 4g per day.  You may use up to 600mg  ibuprofen every 6 hours as needed for pain.  Do not exceed 2.4g of ibuprofen per day.  Please follow-up with your PCP within the next month for further management and workup of your symptoms.  Return to the ER if you have any shortness of breath, difficulty breathing, worsening chest pain, dizziness, jaw pain, left arm or shoulder pain, abdominal pain, unexplained fever, any other new or concerning symptoms.

## 2023-11-18 NOTE — ED Triage Notes (Signed)
 L sided chest tightness, L arm pain, headaches, dizziness for 1 week.   Denies SHOB, NV, diaphoresis, weakness

## 2023-11-18 NOTE — ED Notes (Signed)
 In radiology

## 2023-11-18 NOTE — ED Provider Notes (Signed)
  EMERGENCY DEPARTMENT AT [facility] Provider Note   CSN: [id number] Arrival date & time: 11/18/23  1340     History  Chief Complaint  Patient presents with   Chest Pain    Daniel Winters is a 23 y.o. male with no significant past medical history who presents with concern for intermittent headaches, left-sided chest tightness, and brain fog that have been ongoing for the past 3 to 4 months.  States that he did not used to get headaches before this.  Headaches will come on at random and resolve when he takes Tylenol.  He also reports intermittent tightness sensation in the left side of his chest that worsens with moving his left arm.  Pain is nonexertional, nonpleuritic, does not radiate elsewhere, not associated with eating.  Denies any shortness of breath.   Chest Pain      Home Medications Prior to Admission medications   Medication Sig Start Date End Date Taking? Authorizing Provider  naproxen  (NAPROSYN ) 500 MG tablet Take 1 tablet (500 mg total) by mouth 2 (two) times daily as needed for moderate pain (pain score 4-6). 09/24/23   Adolph Hoop, PA-C      Allergies    Patient has no known allergies.    Review of Systems   Review of Systems  Cardiovascular:  Positive for chest pain.    Physical Exam Updated Vital Signs BP 139/88   Pulse 70   Temp 98.7 F (37.1 C) (Oral)   Resp 17   Ht 6\' 2"  (1.88 m)   Wt 90.7 kg   SpO2 100%   BMI 25.68 kg/m  Physical Exam Vitals and nursing note reviewed.  Constitutional:      General: He is not in acute distress.    Appearance: He is well-developed.  HENT:     Head: Normocephalic and atraumatic.  Eyes:     Extraocular Movements: Extraocular movements intact.     Conjunctiva/sclera: Conjunctivae normal.     Pupils: Pupils are equal, round, and reactive to light.  Cardiovascular:     Rate and Rhythm: Normal rate and regular rhythm.     Heart sounds: No murmur heard.    Comments: Radial pulse 2+  bilaterally Pulmonary:     Effort: Pulmonary effort is normal. No respiratory distress.     Breath sounds: Normal breath sounds.  Abdominal:     Palpations: Abdomen is soft.     Tenderness: There is no abdominal tenderness.  Musculoskeletal:        General: No swelling.       Arms:     Cervical back: Neck supple.     Comments: Anterior left chest wall tender to palpation  Skin:    General: Skin is warm and dry.     Capillary Refill: Capillary refill takes less than 2 seconds.  Neurological:     General: No focal deficit present.     Mental Status: He is alert.  Psychiatric:        Mood and Affect: Mood normal.     ED Results / Procedures / Treatments   Labs (all labs ordered are listed, but only abnormal results are displayed) Labs Reviewed  BASIC METABOLIC PANEL WITH GFR - Abnormal; Notable for the following components:      Result Value   Glucose, Bld 103 (*)    All other components within normal limits  CBC  TROPONIN T, HIGH SENSITIVITY    EKG EKG Interpretation Date/Time:  Sunday Nov 18 2023  14:19:21 EDT Ventricular Rate:  95 PR Interval:  172 QRS Duration:  94 QT Interval:  358 QTC Calculation: 450 R Axis:   77  Text Interpretation: Sinus rhythm Confirmed by Lowery Rue [id number]) on 11/18/2023 3:40:50 PM  Radiology CT Head Wo Contrast Result Date: 11/18/2023 CLINICAL DATA:  Headache, increasing frequency or severity Patient reports dizziness. EXAM: CT HEAD WITHOUT CONTRAST TECHNIQUE: Contiguous axial images were obtained from the base of the skull through the vertex without intravenous contrast. RADIATION DOSE REDUCTION: This exam was performed according to the departmental dose-optimization program which includes automated exposure control, adjustment of the mA and/or kV according to patient size and/or use of iterative reconstruction technique. COMPARISON:  None Available. FINDINGS: Brain: No intracranial hemorrhage, mass effect, or midline shift. No  hydrocephalus. The basilar cisterns are patent. No evidence of territorial infarct or acute ischemia. No extra-axial or intracranial fluid collection. Vascular: No hyperdense vessel or unexpected calcification. Skull: No fracture or focal lesion. Sinuses/Orbits: Paranasal sinuses and mastoid air cells are clear. The visualized orbits are unremarkable. Other: None. IMPRESSION: Normal head CT. Electronically Signed   By: Chadwick Colonel M.D.   On: 11/18/2023 16:42   DG Chest 2 View Result Date: 11/18/2023 CLINICAL DATA:  Chest pain. EXAM: CHEST - 2 VIEW COMPARISON:  September 24, 2023. FINDINGS: The heart size and mediastinal contours are within normal limits. Both lungs are clear. The visualized skeletal structures are unremarkable. IMPRESSION: No active cardiopulmonary disease. Electronically Signed   By: Rosalene Colon M.D.   On: 11/18/2023 15:32    Procedures Procedures    Medications Ordered in ED Medications - No data to display  ED Course/ Medical Decision Making/ A&P             HEART Score: 0                    Medical Decision Making Amount and/or Complexity of Data Reviewed Labs: ordered. Radiology: ordered.    Differential diagnosis includes but is not limited to ACS, arrhythmia, aortic aneurysm, pericarditis, myocarditis, pericardial effusion, cardiac tamponade, musculoskeletal pain, GERD, Boerhaave's syndrome, DVT/PE, pneumonia, pleural effusion, tension headache, migraines, intracranial mass, electrolyte abnormality   ED Course:  Upon initial evaluation, patient is well-appearing, stable vital signs aside from elevated blood pressure of 135/99.  Chest pain reproducible with palpation of the left anterior chest wall. No murmurs heard on cardiac auscultation. No focal neuro deficits noted on exam.    Labs Ordered: I Ordered, and personally interpreted labs.  The pertinent results include:   CBC and BMP unremarkable Troponin under 15  Imaging Studies ordered: I ordered  imaging studies including chest x-ray, CT head  I independently visualized the imaging with scope of interpretation limited to determining acute life threatening conditions related to emergency care.  Chest x-ray and CT head without acute abnormality I agree with the radiologist interpretation   Cardiac Monitoring: / EKG: The patient was maintained on a cardiac monitor.  I personally viewed and interpreted the cardiac monitored which showed an underlying rhythm of: Normal sinus rhythm   Upon re-evaluation, patient still well-appearing, stable vitals.   Low concern for ACS at this time given troponin remains stable with initial troponin of less than 15, chest pain has been intermittent for several months and is reproducible with chest wall palpation, pain non-exertional, and EKG with normal sinus rhythm and no ST changes. HEART score of 0. Chest x-ray without any acute abnormality.  Very low clinical concern for PE  at this time given chest pain has been episodic for months, reproducible with chest wall palpation, no associated shortness of breath, no vital sign abnormalities. Patient did report concern for more frequent headaches and brain fog over the past couple months.  Did not feel strongly that he needed head CT as he is not having any current headache, no neurodeficits.  However, had a shared decision-making conversation with the patient regarding PCP follow-up versus CT head imaging today.  Patient wanted to get his CT, so this was ordered.  Head CT without any acute abnormalities to explain headache.  Suspect his headaches are tension headaches versus migraines.  Will have him follow-up with PCP for further management.  Stable and appropriate for discharge home  Impression: Musculoskeletal chest pain Headaches Brain fog  Disposition:  The patient was discharged home with instructions to follow-up with PCP regarding his symptoms within the next month.  Tylenol and ibuprofen as needed for  headaches. Return precautions given.     This chart was dictated using voice recognition software, Dragon. Despite the best efforts of this provider to proofread and correct errors, errors may still occur which can change documentation meaning.          Final Clinical Impression(s) / ED Diagnoses Final diagnoses:  Musculoskeletal chest pain  Brain fog  Frequent headaches    Rx / DC Orders ED Discharge Orders     None         Rexie Catena, PA-C 11/18/23 1749    Lowery Rue, DO 11/18/23 1810

## 2023-11-20 ENCOUNTER — Telehealth: Payer: Self-pay | Admitting: *Deleted

## 2023-11-20 NOTE — Telephone Encounter (Signed)
 Called pt to give instructions for ETT. No answer and mailbox is full.

## 2023-11-23 ENCOUNTER — Ambulatory Visit

## 2023-11-28 ENCOUNTER — Ambulatory Visit

## 2023-12-07 ENCOUNTER — Ambulatory Visit: Admitting: Neurology

## 2023-12-07 DIAGNOSIS — G5622 Lesion of ulnar nerve, left upper limb: Secondary | ICD-10-CM

## 2023-12-07 DIAGNOSIS — R202 Paresthesia of skin: Secondary | ICD-10-CM | POA: Diagnosis not present

## 2023-12-07 NOTE — Procedures (Signed)
  [facility]  [address] [place], [address]  [place], [place] [address] Tel: [phone] Fax: [phone] Test Date:  12/07/2023  Patient: Daniel Winters DOB: 30-Sep-2000 Physician: Reyna Cava, DO  Sex: Male Height: 6\' 2"  Ref Phys: Karlton Overly, DO  ID#: [id number]   Technician:    History: This is a 23 year old man referred for evaluation of left arm paresthesias.  NCV & EMG Findings: Extensive electrodiagnostic testing of the left upper extremity shows:  Left median, ulnar, and mixed palmar sensory responses are within normal limits. Left median motor response is within normal limits.  Left ulnar motor response shows mildly slowed conduction velocity across the elbow (A Elbow-B Elbow, 43 m/s).   There is no evidence of active or chronic motor axonal loss changes affecting any of the tested muscles.  Motor unit configuration and recruitment pattern is within normal limits.  Impression: Left ulnar neuropathy with slowing across the elbow, demyelinating, very mild. There is no evidence of carpal tunnel syndrome or cervical radiculopathy affecting the left upper extremity.   ___________________________ Reyna Cava, DO    Nerve Conduction Studies   Stim Site NR Peak (ms) Norm Peak (ms) O-P Amp (V) Norm O-P Amp  Left Median Anti Sensory (2nd Digit)  32 C  Wrist    3.2 <3.3 56.5 >20  Left Ulnar Anti Sensory (5th Digit)  32 C  Wrist    2.9 <3.0 41.4 >18     Stim Site NR Onset (ms) Norm Onset (ms) O-P Amp (mV) Norm O-P Amp Site1 Site2 Delta-0 (ms) Dist (cm) Vel (m/s) Norm Vel (m/s)  Left Median Motor (Abd Poll Brev)  32 C  Wrist    3.0 <3.9 12.3 >6 Elbow Wrist 6.1 36.0 59 >51  Elbow    9.1  11.9         Left Ulnar Motor (Abd Dig Minimi)  32 C  Wrist    2.5 <3.0 12.2 >8 B Elbow Wrist 4.5 25.0 56 >51  B Elbow    7.0  11.0  A Elbow B Elbow 2.3 10.0 *43 >51  A Elbow    9.3  10.7            Stim Site NR Peak (ms) Norm Peak (ms) P-T Amp (V) Site1 Site2  Delta-P (ms) Norm Delta (ms)  Left Median/Ulnar Palm Comparison (Wrist - 8cm)  32 C  Median Palm    1.5 <2.2 113.4 Median Palm Ulnar Palm 0.0   Ulnar Palm    1.5 <2.2 19.6       Electromyography   Side Muscle Ins.Act Fibs Fasc Recrt Amp Dur Poly Activation Comment  Left 1stDorInt Nml Nml Nml Nml Nml Nml Nml Nml N/A  Left PronatorTeres Nml Nml Nml Nml Nml Nml Nml Nml N/A  Left Biceps Nml Nml Nml Nml Nml Nml Nml Nml N/A  Left Triceps Nml Nml Nml Nml Nml Nml Nml Nml N/A  Left Deltoid Nml Nml Nml Nml Nml Nml Nml Nml N/A  Left FlexCarpiUln Nml Nml Nml Nml Nml Nml Nml Nml N/A      Waveforms:
